# Patient Record
Sex: Male | Born: 1985 | Race: White | Hispanic: No | Marital: Married | Smoking: Never smoker
Health system: Southern US, Community
[De-identification: ages and names within clinical notes are randomized; demographics above are authoritative.]

## PROBLEM LIST (undated history)

## (undated) DIAGNOSIS — F988 Other specified behavioral and emotional disorders with onset usually occurring in childhood and adolescence: Secondary | ICD-10-CM

---

## 2016-01-12 ENCOUNTER — Emergency Department (HOSPITAL_BASED_OUTPATIENT_CLINIC_OR_DEPARTMENT_OTHER): Payer: No Typology Code available for payment source

## 2016-01-12 ENCOUNTER — Emergency Department (HOSPITAL_BASED_OUTPATIENT_CLINIC_OR_DEPARTMENT_OTHER)
Admission: EM | Admit: 2016-01-12 | Discharge: 2016-01-12 | Disposition: A | Discharge status: Home / Self Care | Payer: No Typology Code available for payment source | Attending: Emergency Medicine | Admitting: Emergency Medicine

## 2016-01-12 ENCOUNTER — Encounter (HOSPITAL_BASED_OUTPATIENT_CLINIC_OR_DEPARTMENT_OTHER): Payer: Self-pay | Admitting: *Deleted

## 2016-01-12 DIAGNOSIS — S39012A Strain of muscle, fascia and tendon of lower back, initial encounter: Secondary | ICD-10-CM

## 2016-01-12 DIAGNOSIS — F909 Attention-deficit hyperactivity disorder, unspecified type: Secondary | ICD-10-CM | POA: Diagnosis not present

## 2016-01-12 DIAGNOSIS — Z79899 Other long term (current) drug therapy: Secondary | ICD-10-CM | POA: Diagnosis not present

## 2016-01-12 DIAGNOSIS — S8991XA Unspecified injury of right lower leg, initial encounter: Secondary | ICD-10-CM | POA: Diagnosis not present

## 2016-01-12 DIAGNOSIS — S199XXA Unspecified injury of neck, initial encounter: Secondary | ICD-10-CM | POA: Diagnosis present

## 2016-01-12 DIAGNOSIS — T148XXA Other injury of unspecified body region, initial encounter: Secondary | ICD-10-CM

## 2016-01-12 DIAGNOSIS — S161XXA Strain of muscle, fascia and tendon at neck level, initial encounter: Secondary | ICD-10-CM | POA: Diagnosis not present

## 2016-01-12 DIAGNOSIS — Y998 Other external cause status: Secondary | ICD-10-CM | POA: Insufficient documentation

## 2016-01-12 DIAGNOSIS — S29012A Strain of muscle and tendon of back wall of thorax, initial encounter: Secondary | ICD-10-CM | POA: Diagnosis not present

## 2016-01-12 DIAGNOSIS — T148 Other injury of unspecified body region: Secondary | ICD-10-CM | POA: Insufficient documentation

## 2016-01-12 DIAGNOSIS — Y9389 Activity, other specified: Secondary | ICD-10-CM | POA: Insufficient documentation

## 2016-01-12 DIAGNOSIS — Y92410 Unspecified street and highway as the place of occurrence of the external cause: Secondary | ICD-10-CM | POA: Diagnosis not present

## 2016-01-12 HISTORY — DX: Other specified behavioral and emotional disorders with onset usually occurring in childhood and adolescence: F98.8

## 2016-01-12 MED ORDER — CYCLOBENZAPRINE HCL 10 MG PO TABS: 10.0000 mg | ORAL_TABLET | Freq: Two times a day (BID) | ORAL | Status: AC | PRN
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
## 2016-01-12 NOTE — Discharge Instructions (Signed)
Cervical Sprain °A cervical sprain is an injury in the neck in which the strong, fibrous tissues (ligaments) that connect your neck bones stretch or tear. Cervical sprains can range from mild to severe. Severe cervical sprains can cause the neck vertebrae to be unstable. This can lead to damage of the spinal cord and can result in serious nervous system problems. The amount of time it takes for a cervical sprain to get better depends on the cause and extent of the injury. Most cervical sprains heal in 1 to 3 weeks. °CAUSES  °Severe cervical sprains may be caused by:  °· Contact sport injuries (such as from football, rugby, wrestling, hockey, auto racing, gymnastics, diving, martial arts, or boxing).   °· Motor vehicle collisions.   °· Whiplash injuries. This is an injury from a sudden forward and backward whipping movement of the head and neck.  °· Falls.   °Mild cervical sprains may be caused by:  °· Being in an awkward position, such as while cradling a telephone between your ear and shoulder.   °· Sitting in a chair that does not offer proper support.   °· Working at a poorly designed computer station.   °· Looking up or down for long periods of time.   °SYMPTOMS  °· Pain, soreness, stiffness, or a burning sensation in the front, back, or sides of the neck. This discomfort may develop immediately after the injury or slowly, 24 hours or more after the injury.   °· Pain or tenderness directly in the middle of the back of the neck.   °· Shoulder or upper back pain.   °· Limited ability to move the neck.   °· Headache.   °· Dizziness.   °· Weakness, numbness, or tingling in the hands or arms.   °· Muscle spasms.   °· Difficulty swallowing or chewing.   °· Tenderness and swelling of the neck.   °DIAGNOSIS  °Most of the time your health care provider can diagnose a cervical sprain by taking your history and doing a physical exam. Your health care provider will ask about previous neck injuries and any known neck  problems, such as arthritis in the neck. X-rays may be taken to find out if there are any other problems, such as with the bones of the neck. Other tests, such as a CT scan or MRI, may also be needed.  °TREATMENT  °Treatment depends on the severity of the cervical sprain. Mild sprains can be treated with rest, keeping the neck in place (immobilization), and pain medicines. Severe cervical sprains are immediately immobilized. Further treatment is done to help with pain, muscle spasms, and other symptoms and may include: °· Medicines, such as pain relievers, numbing medicines, or muscle relaxants.   °· Physical therapy. This may involve stretching exercises, strengthening exercises, and posture training. Exercises and improved posture can help stabilize the neck, strengthen muscles, and help stop symptoms from returning.   °HOME CARE INSTRUCTIONS  °· Put ice on the injured area.   °· Put ice in a plastic bag.   °· Place a towel between your skin and the bag.   °· Leave the ice on for 15-20 minutes, 3-4 times a day.   °· If your injury was severe, you may have been given a cervical collar to wear. A cervical collar is a two-piece collar designed to keep your neck from moving while it heals. °· Do not remove the collar unless instructed by your health care provider. °· If you have long hair, keep it outside of the collar. °· Ask your health care provider before making any adjustments to your collar. Minor   adjustments may be required over time to improve comfort and reduce pressure on your chin or on the back of your head. °· If you are allowed to remove the collar for cleaning or bathing, follow your health care provider's instructions on how to do so safely. °· Keep your collar clean by wiping it with mild soap and water and drying it completely. If the collar you have been given includes removable pads, remove them every 1-2 days and hand wash them with soap and water. Allow them to air dry. They should be completely  dry before you wear them in the collar. °· If you are allowed to remove the collar for cleaning and bathing, wash and dry the skin of your neck. Check your skin for irritation or sores. If you see any, tell your health care provider. °· Do not drive while wearing the collar.   °· Only take over-the-counter or prescription medicines for pain, discomfort, or fever as directed by your health care provider.   °· Keep all follow-up appointments as directed by your health care provider.   °· Keep all physical therapy appointments as directed by your health care provider.   °· Make any needed adjustments to your workstation to promote good posture.   °· Avoid positions and activities that make your symptoms worse.   °· Warm up and stretch before being active to help prevent problems.   °SEEK MEDICAL CARE IF:  °· Your pain is not controlled with medicine.   °· You are unable to decrease your pain medicine over time as planned.   °· Your activity level is not improving as expected.   °SEEK IMMEDIATE MEDICAL CARE IF:  °· You develop any bleeding. °· You develop stomach upset. °· You have signs of an allergic reaction to your medicine.   °· Your symptoms get worse.   °· You develop new, unexplained symptoms.   °· You have numbness, tingling, weakness, or paralysis in any part of your body.   °MAKE SURE YOU:  °· Understand these instructions. °· Will watch your condition. °· Will get help right away if you are not doing well or get worse. °  °This information is not intended to replace advice given to you by your health care provider. Make sure you discuss any questions you have with your health care provider. °  °Document Released: 10/10/2007 Document Revised: 12/18/2013 Document Reviewed: 06/20/2013 °Elsevier Interactive Patient Education ©2016 [employer] ° °Lumbosacral Strain °Lumbosacral strain is a strain of any of the parts that make up your lumbosacral vertebrae. Your lumbosacral vertebrae are the bones that make up  the lower third of your backbone. Your lumbosacral vertebrae are held together by muscles and tough, fibrous tissue (ligaments).  °CAUSES  °A sudden blow to your back can cause lumbosacral strain. Also, anything that causes an excessive stretch of the muscles in the low back can cause this strain. This is typically seen when people exert themselves strenuously, fall, lift heavy objects, bend, or crouch repeatedly. °RISK FACTORS °· Physically demanding work. °· Participation in pushing or pulling sports or sports that require a sudden twist of the back (tennis, golf, baseball). °· Weight lifting. °· Excessive lower back curvature. °· Forward-tilted pelvis. °· Weak back or abdominal muscles or both. °· Tight hamstrings. °SIGNS AND SYMPTOMS  °Lumbosacral strain may cause pain in the area of your injury or pain that moves (radiates) down your leg.  °DIAGNOSIS °Your health care provider can often diagnose lumbosacral strain through a physical exam. In some cases, you may need tests such as X-ray exams.  °TREATMENT  °  Treatment for your lower back injury depends on many factors that your clinician will have to evaluate. However, most treatment will include the use of anti-inflammatory medicines. °HOME CARE INSTRUCTIONS  °· Avoid hard physical activities (tennis, racquetball, waterskiing) if you are not in proper physical condition for it. This may aggravate or create problems. °· If you have a back problem, avoid sports requiring sudden body movements. Swimming and walking are generally safer activities. °· Maintain good posture. °· Maintain a healthy weight. °· For acute conditions, you may put ice on the injured area. °· Put ice in a plastic bag. °· Place a towel between your skin and the bag. °· Leave the ice on for 20 minutes, 2-3 times a day. °· When the low back starts healing, stretching and strengthening exercises may be recommended. °SEEK MEDICAL CARE IF: °· Your back pain is getting worse. °· You experience  severe back pain not relieved with medicines. °SEEK IMMEDIATE MEDICAL CARE IF:  °· You have numbness, tingling, weakness, or problems with the use of your arms or legs. °· There is a change in bowel or bladder control. °· You have increasing pain in any area of the body, including your belly (abdomen). °· You notice shortness of breath, dizziness, or feel faint. °· You feel sick to your stomach (nauseous), are throwing up (vomiting), or become sweaty. °· You notice discoloration of your toes or legs, or your feet get very cold. °MAKE SURE YOU:  °· Understand these instructions. °· Will watch your condition. °· Will get help right away if you are not doing well or get worse. °  °This information is not intended to replace advice given to you by your health care provider. Make sure you discuss any questions you have with your health care provider. °  °Document Released: 09/22/2005 Document Revised: 01/03/2015 Document Reviewed: 08/01/2013 °Elsevier Interactive Patient Education ©2016 [employer] ° °Motor Vehicle Collision °It is common to have multiple bruises and sore muscles after a motor vehicle collision (MVC). These tend to feel worse for the first 24 hours. You may have the most stiffness and soreness over the first several hours. You may also feel worse when you wake up the first morning after your collision. After this point, you will usually begin to improve with each day. The speed of improvement often depends on the severity of the collision, the number of injuries, and the location and nature of these injuries. °HOME CARE INSTRUCTIONS °· Put ice on the injured area. °¨ Put ice in a plastic bag. °¨ Place a towel between your skin and the bag. °¨ Leave the ice on for 15-20 minutes, 3-4 times a day, or as directed by your health care provider. °· Drink enough fluids to keep your urine clear or pale yellow. Do not drink alcohol. °· Take a warm shower or bath once or twice a day. This will increase blood flow  to sore muscles. °· You may return to activities as directed by your caregiver. Be careful when lifting, as this may aggravate neck or back pain. °· Only take over-the-counter or prescription medicines for pain, discomfort, or fever as directed by your caregiver. Do not use aspirin. This may increase bruising and bleeding. °SEEK IMMEDIATE MEDICAL CARE IF: °· You have numbness, tingling, or weakness in the arms or legs. °· You develop severe headaches not relieved with medicine. °· You have severe neck pain, especially tenderness in the middle of the back of your neck. °· You have changes   in bowel or bladder control. °· There is increasing pain in any area of the body. °· You have shortness of breath, light-headedness, dizziness, or fainting. °· You have chest pain. °· You feel sick to your stomach (nauseous), throw up (vomit), or sweat. °· You have increasing abdominal discomfort. °· There is blood in your urine, stool, or vomit. °· You have pain in your shoulder (shoulder strap areas). °· You feel your symptoms are getting worse. °MAKE SURE YOU: °· Understand these instructions. °· Will watch your condition. °· Will get help right away if you are not doing well or get worse. °  °This information is not intended to replace advice given to you by your health care provider. Make sure you discuss any questions you have with your health care provider. °  °Document Released: 12/13/2005 Document Revised: 01/03/2015 Document Reviewed: 05/12/2011 °Elsevier Interactive Patient Education ©2016 [employer] ° °
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
## 2017-06-26 IMAGING — DX DG CERVICAL SPINE COMPLETE 4+V
6 series · 6 of 6 positions shown · non-contrast
Comparison: Concurrently obtained radiographs of the thoracic spine

CLINICAL DATA: 29-year-old male with persistent cervical and
thoracic spine pain.

EXAM:
CERVICAL SPINE - COMPLETE 4+ VIEW

[c-spine lat]
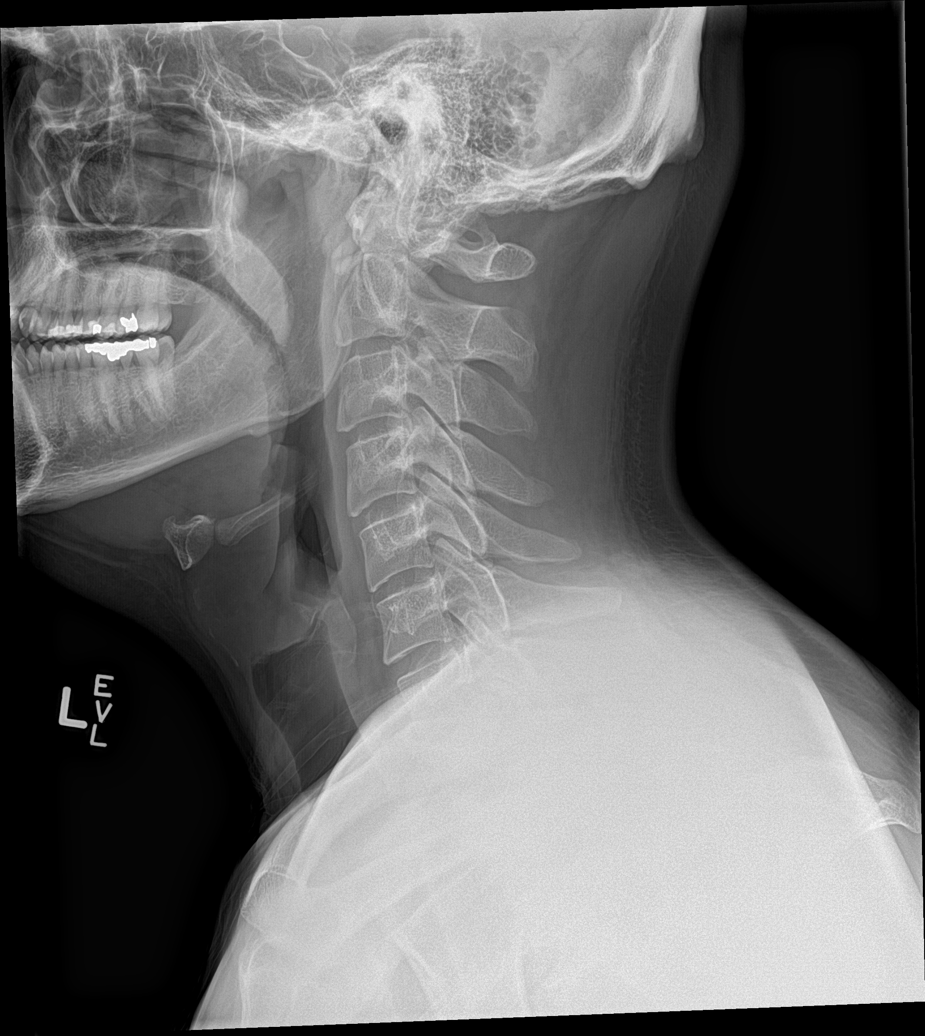

[c-spine obl (1 of 2)]
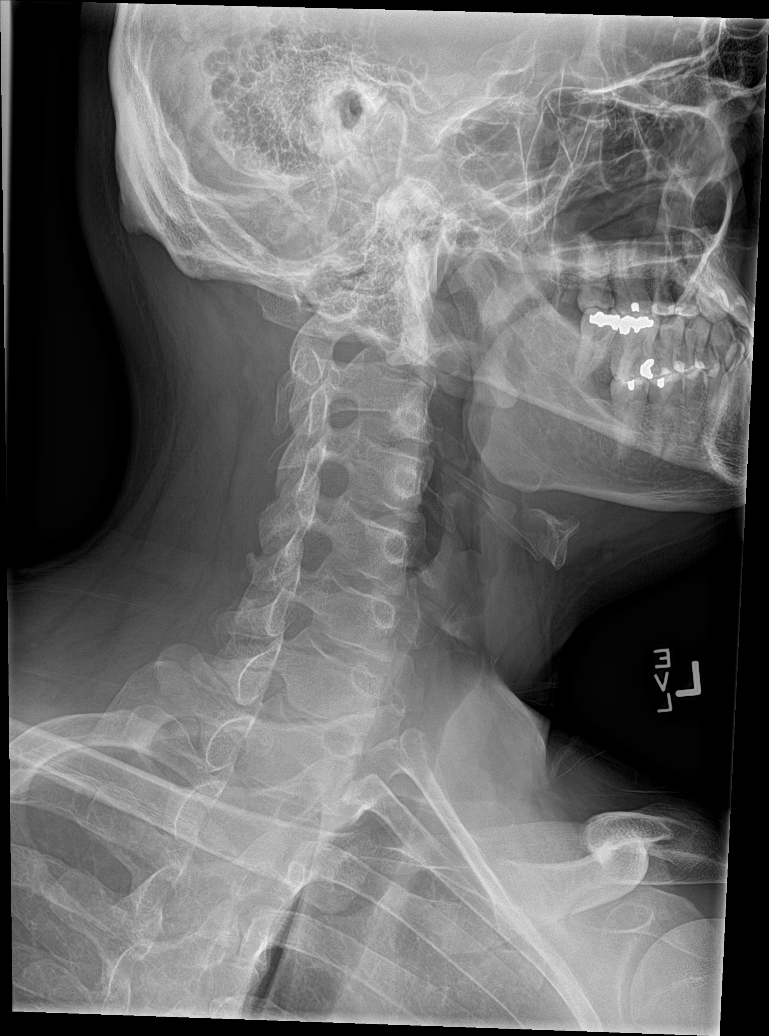

[c-spine obl (2 of 2)]
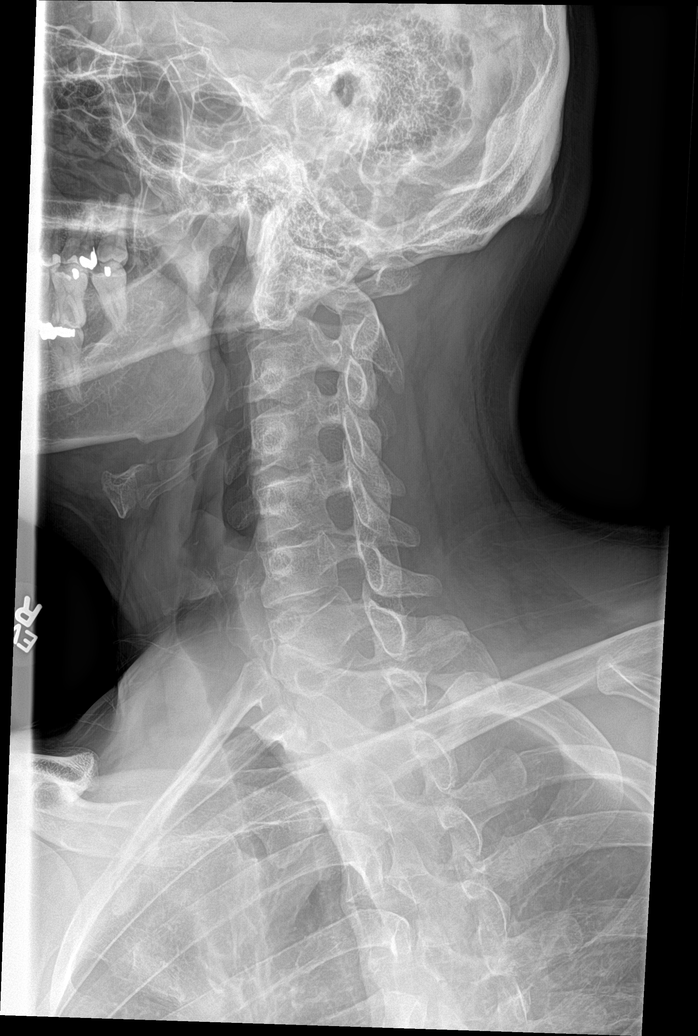

[c-spine ap]
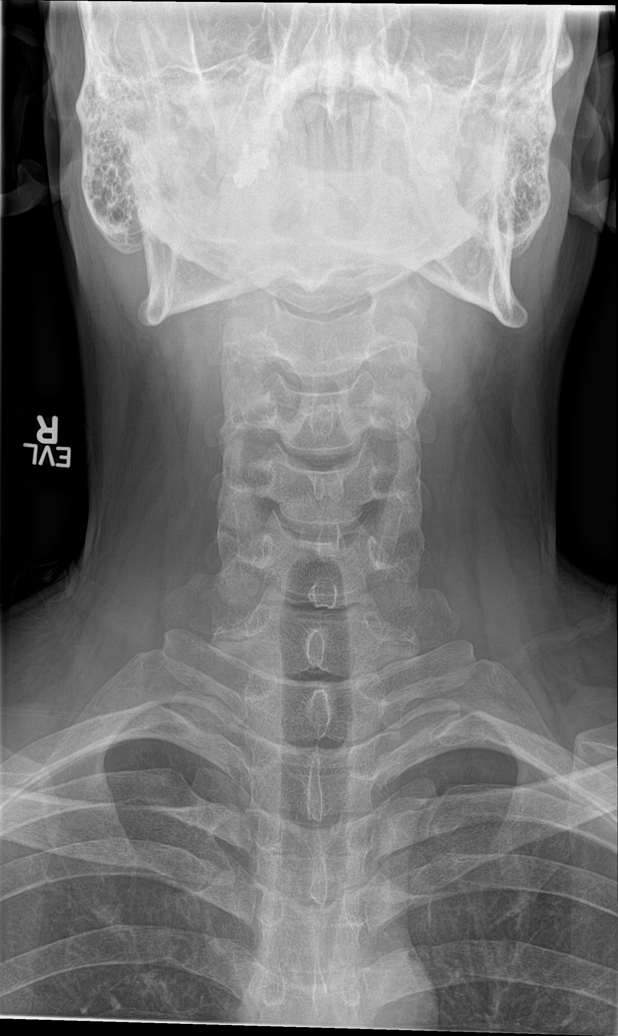

[c-spine open mouth]
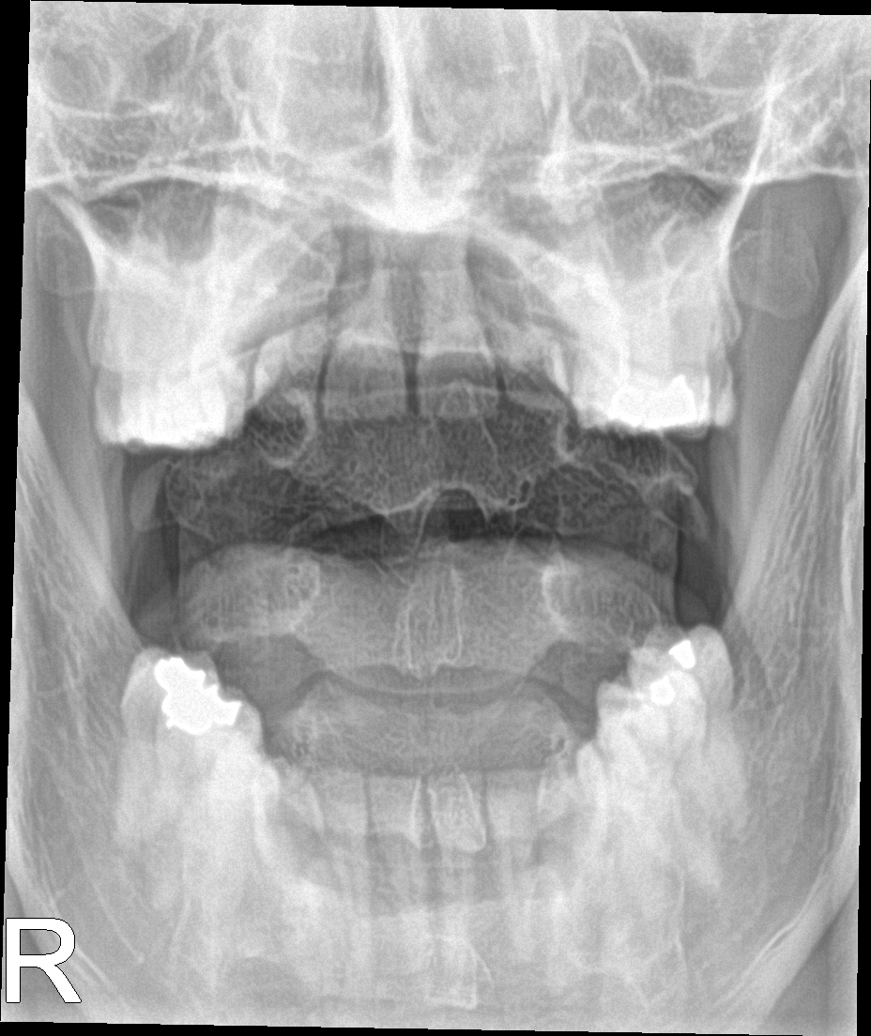

[[person_name]]
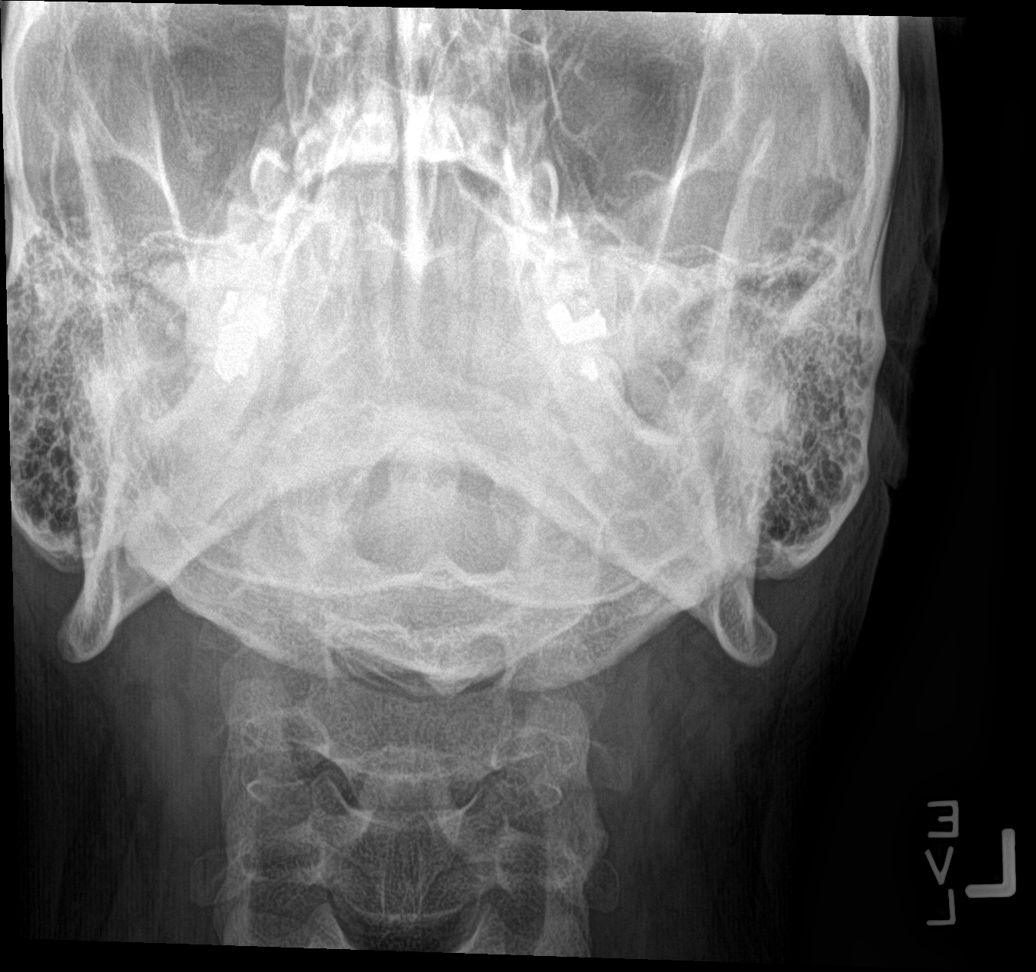

[6 of 6 positions shown; findings below may reference images not displayed]

FINDINGS: There is no evidence of cervical spine fracture or prevertebral soft
tissue swelling. Alignment is normal. No other significant bone
abnormalities are identified.
IMPRESSION: Negative cervical spine radiographs.
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
